# Patient Record
Sex: Male | Born: 1990 | Race: Black or African American | Hispanic: No | Marital: Single | Smoking: Current every day smoker
Health system: Southern US, Community
[De-identification: ages and names within clinical notes are randomized; demographics above are authoritative.]

## PROBLEM LIST (undated history)

## (undated) HISTORY — PX: NO PAST SURGERIES: SHX2092

---

## 2014-09-19 ENCOUNTER — Emergency Department (HOSPITAL_COMMUNITY)
Admission: EM | Admit: 2014-09-19 | Discharge: 2014-09-20 | Disposition: A | Discharge status: Home / Self Care | Payer: Self-pay | Attending: Emergency Medicine | Admitting: Emergency Medicine

## 2014-09-19 ENCOUNTER — Encounter (HOSPITAL_COMMUNITY): Payer: Self-pay

## 2014-09-19 DIAGNOSIS — Y9389 Activity, other specified: Secondary | ICD-10-CM | POA: Insufficient documentation

## 2014-09-19 DIAGNOSIS — S8001XA Contusion of right knee, initial encounter: Secondary | ICD-10-CM | POA: Insufficient documentation

## 2014-09-19 DIAGNOSIS — Y998 Other external cause status: Secondary | ICD-10-CM | POA: Insufficient documentation

## 2014-09-19 DIAGNOSIS — Y92410 Unspecified street and highway as the place of occurrence of the external cause: Secondary | ICD-10-CM | POA: Insufficient documentation

## 2014-09-19 DIAGNOSIS — S161XXA Strain of muscle, fascia and tendon at neck level, initial encounter: Secondary | ICD-10-CM | POA: Insufficient documentation

## 2014-09-19 DIAGNOSIS — S8002XA Contusion of left knee, initial encounter: Secondary | ICD-10-CM | POA: Insufficient documentation
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
## 2014-09-20 ENCOUNTER — Emergency Department (HOSPITAL_COMMUNITY): Payer: Self-pay

## 2014-09-20 MED ORDER — HYDROCODONE-ACETAMINOPHEN 5-325 MG PO TABS
2.0000 | ORAL_TABLET | Freq: Once | ORAL | Status: AC
  Administered 2014-09-20: 2 via ORAL
  Filled 2014-09-20: qty 2

## 2014-09-20 MED ORDER — IBUPROFEN 800 MG PO TABS: 800.0000 mg | ORAL_TABLET | Freq: Three times a day (TID) | ORAL | Status: DC

## 2014-09-20 MED ORDER — METHOCARBAMOL 500 MG PO TABS: 500.0000 mg | ORAL_TABLET | Freq: Three times a day (TID) | ORAL | Status: DC

## 2014-09-20 MED ORDER — DIAZEPAM 5 MG PO TABS
5.0000 mg | ORAL_TABLET | Freq: Once | ORAL | Status: AC
  Administered 2014-09-20: 5 mg via ORAL
  Filled 2014-09-20: qty 1

## 2014-09-20 MED ORDER — HYDROCODONE-ACETAMINOPHEN 5-325 MG PO TABS: 1.0000 | ORAL_TABLET | ORAL | Status: DC | PRN

## 2014-09-20 MED ORDER — ONDANSETRON HCL 4 MG PO TABS
4.0000 mg | ORAL_TABLET | Freq: Once | ORAL | Status: AC
  Administered 2014-09-20: 4 mg via ORAL
  Filled 2014-09-20: qty 1
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
## 2018-11-25 ENCOUNTER — Encounter (HOSPITAL_COMMUNITY): Payer: Self-pay

## 2018-11-25 ENCOUNTER — Emergency Department (HOSPITAL_COMMUNITY): Payer: No Typology Code available for payment source

## 2018-11-25 ENCOUNTER — Other Ambulatory Visit: Payer: Self-pay

## 2018-11-25 ENCOUNTER — Emergency Department (HOSPITAL_COMMUNITY)
Admission: EM | Admit: 2018-11-25 | Discharge: 2018-11-25 | Disposition: A | Discharge status: Home / Self Care | Payer: No Typology Code available for payment source | Attending: Emergency Medicine | Admitting: Emergency Medicine

## 2018-11-25 DIAGNOSIS — S29002A Unspecified injury of muscle and tendon of back wall of thorax, initial encounter: Secondary | ICD-10-CM | POA: Insufficient documentation

## 2018-11-25 DIAGNOSIS — Y998 Other external cause status: Secondary | ICD-10-CM | POA: Diagnosis not present

## 2018-11-25 DIAGNOSIS — Y92410 Unspecified street and highway as the place of occurrence of the external cause: Secondary | ICD-10-CM | POA: Insufficient documentation

## 2018-11-25 DIAGNOSIS — Y93I9 Activity, other involving external motion: Secondary | ICD-10-CM | POA: Diagnosis not present

## 2018-11-25 DIAGNOSIS — S8001XA Contusion of right knee, initial encounter: Secondary | ICD-10-CM | POA: Diagnosis not present

## 2018-11-25 DIAGNOSIS — S8991XA Unspecified injury of right lower leg, initial encounter: Secondary | ICD-10-CM | POA: Diagnosis present

## 2018-11-25 DIAGNOSIS — T148XXA Other injury of unspecified body region, initial encounter: Secondary | ICD-10-CM

## 2018-11-25 MED ORDER — CYCLOBENZAPRINE HCL 10 MG PO TABS: 10.0000 mg | ORAL_TABLET | Freq: Three times a day (TID) | ORAL | 0 refills | Status: DC

## 2018-11-25 MED ORDER — TRAMADOL HCL 50 MG PO TABS: 50.0000 mg | ORAL_TABLET | Freq: Four times a day (QID) | ORAL | 0 refills | Status: DC | PRN

## 2018-11-25 MED ORDER — CYCLOBENZAPRINE HCL 10 MG PO TABS
10.0000 mg | ORAL_TABLET | Freq: Once | ORAL | Status: AC
  Administered 2018-11-25: 20:00:00 10 mg via ORAL
  Filled 2018-11-25: qty 1

## 2018-11-25 MED ORDER — ONDANSETRON HCL 4 MG PO TABS
4.0000 mg | ORAL_TABLET | Freq: Once | ORAL | Status: AC
  Administered 2018-11-25: 4 mg via ORAL
  Filled 2018-11-25: qty 1

## 2018-11-25 MED ORDER — IBUPROFEN 600 MG PO TABS: 600.0000 mg | ORAL_TABLET | Freq: Four times a day (QID) | ORAL | 0 refills | Status: DC

## 2018-11-25 MED ORDER — TRAMADOL HCL 50 MG PO TABS
100.0000 mg | ORAL_TABLET | Freq: Once | ORAL | Status: AC
  Administered 2018-11-25: 100 mg via ORAL
  Filled 2018-11-25: qty 2

## 2018-11-25 NOTE — Discharge Instructions (Addendum)
Your x-rays are negative for acute fracture or dislocation.  Your CT scans are negative for skull fracture or injury to the brain or to the cervical spine.  Please use ibuprofen with breakfast, lunch, dinner, and at bedtime.  Please use Flexeril 3 times daily for spasm pain.  Use Ultram every 6 hours as needed for more severe pain.  Ultram and Flexeril may cause drowsiness, and/or lightheadedness.  Please do not drive a vehicle, operate machinery, drink alcohol, or participate in activities requiring concentration when taking either these medications.  Please see your primary physician or return to the emergency department if any worsening of your symptoms, changes in your condition, problems, or concerns.
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
## 2019-07-09 ENCOUNTER — Other Ambulatory Visit: Payer: Self-pay

## 2019-07-09 ENCOUNTER — Ambulatory Visit (INDEPENDENT_AMBULATORY_CARE_PROVIDER_SITE_OTHER): Payer: Self-pay | Admitting: Family Medicine

## 2019-07-09 ENCOUNTER — Encounter: Payer: Self-pay | Admitting: Family Medicine

## 2019-07-09 VITALS — BP 130/76 | HR 84 | Temp 98.1°F | Ht 65.25 in | Wt 239.8 lb

## 2019-07-09 DIAGNOSIS — Z720 Tobacco use: Secondary | ICD-10-CM

## 2019-07-09 DIAGNOSIS — Z7689 Persons encountering health services in other specified circumstances: Secondary | ICD-10-CM

## 2019-07-09 DIAGNOSIS — E669 Obesity, unspecified: Secondary | ICD-10-CM

## 2019-07-09 NOTE — Patient Instructions (Signed)
A resource that I like is www.dietdoctor.com/diabetes/diet  Here are some guidelines to help you with meal planning -  Avoid all processed and packaged foods (bread, pasta, crackers, chips, etc) and beverages containing calories.  Avoid added sugars and excessive natural sugars.  Attention to how you feel if you consume artificial sweeteners.  Do they make you more hungry or raise your blood sugar?  With every meal and snack, aim to get 20 g of protein (3 ounces of meat, 4 ounces of fish, 3 eggs, protein powder, 1 cup Greek yogurt, 1 cup cottage cheese, etc.)  Increase fiber in the form of non-starchy vegetables.  These help you feel full with very little carbohydrates and are good for gut health.  Eat 1 serving healthy carb per meal- 1/2 cup brown rice, beans, potato, corn- pay attention to whether or not this significantly raises your blood sugar. If it does, reduce the frequency you consume these.   Eat 2-3 servings of lower sugar fruits daily.  This includes berries, apples, oranges, peaches, pears, one half banana.  Have small amounts of good fats such as avocado, nuts, olive oil, nut butters, olives.  Add a little cheese to your salads to make them tasty.    

## 2019-07-09 NOTE — Progress Notes (Signed)
   Subjective:    Patient ID: Jeffrey Rasmussen, male    DOB: 1991-03-24, 28 y.o.   MRN: [id number]  HPI This is a 28 yo male who presents today to establish care. He is a Patent attorney. Lives with his fianace (also my patient). Enjoys travel, hanging out. Wants to go into car sales and open his own dealership.   Chief Complaint  Patient presents with  . Establish Care    seen provider in [place] about 1 year ago not sure of the name  . Discuss weight management    Last CPE- approximately 1 year Tdap- unsure Flu- never  Dental- this year Eye- unsure, many years ago Exercise- not currently Tobacco- started smoking 4 months ago.  Diet- overeats, likes sweets. Some fast food, drinks soda several times a week.   Obesity- has used phentermine in past, was able to maintain for a couple of months. Eats fast food and fried food frequently. Drinks soda daily. Has not been exercising recently. Wants to resume weight lifting.   His mother had a stroke at age 1 and his dad has diabetes. He is concerned about his health.   ROS- denies headache, visual changes, chest pain, SOB, abdominal pain, N/V/D/C.   Past Surgical History:  Procedure Laterality Date  . NO PAST SURGERIES     No past medical history on file. Family History  Problem Relation Age of Onset  . Seizures Mother   . Stroke Mother 23  . Diabetes Father    Social History   Tobacco Use  . Smoking status: Current Every Day Smoker    Packs/day: 0.25    Types: Cigarettes  . Smokeless tobacco: Never Used  . Tobacco comment: started august 2020  Substance Use Topics  . Alcohol use: Yes    Comment: sometimes on the weekend  . Drug use: Never      Review of Systems Per HPI    Objective:   Physical Exam Physical Exam  Constitutional: Oriented to person, place, and time. He appears well-developed and well-nourished.  HENT:  Head: Normocephalic and atraumatic.  Eyes: Conjunctivae are normal.  Neck: Normal  range of motion. Neck supple.  Cardiovascular: Normal rate, regular rhythm and normal heart sounds.   Pulmonary/Chest: Effort normal and breath sounds normal.  Musculoskeletal: Normal range of motion.  Neurological: Alert and oriented to person, place, and time.  Skin: Skin is warm and dry.  Psychiatric: Normal mood and affect. Behavior is normal. Judgment and thought content normal.  Vitals reviewed.     BP 130/76   Pulse 84   Temp 98.1 F (36.7 C)   Ht 5' 5.25" (1.657 m)   Wt 239 lb 12 oz (108.7 kg)   SpO2 97%   BMI 39.59 kg/m  Wt Readings from Last 3 Encounters:  07/09/19 239 lb 12 oz (108.7 kg)  11/25/18 260 lb (117.9 kg)  09/19/14 192 lb (87.1 kg)       Assessment & Plan:  1. Encounter to establish care - reviewed available records, he will try to get his immunization record from his mother, ? HPV vaccine status  2. Obesity (BMI 35.0-39.9 without comorbidity) - encouraged him to make healthy food choices, provided written resources - Lipid Panel - Hemoglobin A1c - TSH  3. Tobacco abuse - discussed effects on his health and smoking cessation strategies  Clarene Reamer, FNP-BC  [facility], [place]  07/11/2019 6:23 PM

## 2019-07-10 LAB — HEMOGLOBIN A1C: Hgb A1c MFr Bld: 5.6 % (ref 4.6–6.5)

## 2019-07-10 LAB — LIPID PANEL
Cholesterol: 168 mg/dL (ref 0–200)
HDL: 31.2 mg/dL — ABNORMAL LOW (ref >39.00)
LDL Cholesterol: 113 mg/dL — ABNORMAL HIGH (ref 0–99)
NonHDL: 136.9
Total CHOL/HDL Ratio: 5
Triglycerides: 119 mg/dL (ref 0.0–149.0)
VLDL: 23.8 mg/dL (ref 0.0–40.0)

## 2019-07-10 LAB — TSH: TSH: 1.71 u[IU]/mL (ref 0.35–4.50)

## 2019-07-11 ENCOUNTER — Encounter: Payer: Self-pay | Admitting: Family Medicine

## 2019-07-11 DIAGNOSIS — E669 Obesity, unspecified: Secondary | ICD-10-CM | POA: Insufficient documentation

## 2019-08-09 ENCOUNTER — Encounter: Payer: Self-pay | Admitting: Family Medicine
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
## 2019-10-22 ENCOUNTER — Telehealth: Payer: Self-pay | Admitting: Family Medicine

## 2019-10-22 NOTE — Telephone Encounter (Signed)
Please call patient and tell him that he is welcome to transfer to someone else in the office. I am not sure who might prescribe it for him. I am happy to put in a referral to medical weight loss if he would like to try that.

## 2019-10-22 NOTE — Telephone Encounter (Signed)
Patient called today . Stated he was seen on 12/2 for new patient appointment. He asked about being prescribed phentermine for weight loss. Patient said that all his test were normal so his provider was not able to prescribe this.  Patient wanted to know if there is anyone else in the office that he could see to get the medication.   He stated that he has taken the medication before and it has helped him with his weight loss. Patient said that he had to stop because of personal problems he was going through but wanted to restart.   Can you help with this?  Because I was not sure about sending this around to other providers if his labs are normal would they prescribe this?

## 2019-10-23 NOTE — Telephone Encounter (Signed)
Anastasiya asked me about this yesterday. I did not realize he was already a [facility] pt. I don't think he needs to switch providers within the office for Phentermine. I agree, may refer him to Medical [facility].

## 2019-10-23 NOTE — Telephone Encounter (Signed)
Patient advised. Patient may try another office. Patient will let us know how he wants to proceed in the future

## 2019-10-29 ENCOUNTER — Ambulatory Visit: Payer: Self-pay | Attending: Internal Medicine

## 2019-10-29 DIAGNOSIS — Z20822 Contact with and (suspected) exposure to covid-19: Secondary | ICD-10-CM

## 2019-10-30 LAB — SARS-COV-2, NAA 2 DAY TAT

## 2019-10-30 LAB — NOVEL CORONAVIRUS, NAA: SARS-CoV-2, NAA: NOT DETECTED
